# Patient Record
Sex: Male | Born: 1980 | Race: Black or African American | Hispanic: No
Health system: Southern US, Community
[De-identification: ages and names within clinical notes are randomized; demographics above are authoritative.]

---

## 2014-12-26 ENCOUNTER — Encounter (HOSPITAL_COMMUNITY): Payer: Self-pay

## 2014-12-26 ENCOUNTER — Emergency Department (HOSPITAL_COMMUNITY): Payer: Self-pay

## 2014-12-26 ENCOUNTER — Emergency Department (HOSPITAL_COMMUNITY)
Admission: EM | Admit: 2014-12-26 | Discharge: 2014-12-26 | Disposition: A | Discharge status: Home / Self Care | Payer: Self-pay | Attending: Emergency Medicine | Admitting: Emergency Medicine

## 2014-12-26 DIAGNOSIS — Y9289 Other specified places as the place of occurrence of the external cause: Secondary | ICD-10-CM | POA: Insufficient documentation

## 2014-12-26 DIAGNOSIS — S8992XA Unspecified injury of left lower leg, initial encounter: Secondary | ICD-10-CM | POA: Insufficient documentation

## 2014-12-26 DIAGNOSIS — W108XXA Fall (on) (from) other stairs and steps, initial encounter: Secondary | ICD-10-CM | POA: Insufficient documentation

## 2014-12-26 DIAGNOSIS — W19XXXA Unspecified fall, initial encounter: Secondary | ICD-10-CM

## 2014-12-26 DIAGNOSIS — M25562 Pain in left knee: Secondary | ICD-10-CM

## 2014-12-26 DIAGNOSIS — Y9301 Activity, walking, marching and hiking: Secondary | ICD-10-CM | POA: Insufficient documentation

## 2014-12-26 DIAGNOSIS — S3992XA Unspecified injury of lower back, initial encounter: Secondary | ICD-10-CM | POA: Insufficient documentation

## 2014-12-26 DIAGNOSIS — Y998 Other external cause status: Secondary | ICD-10-CM | POA: Insufficient documentation

## 2014-12-26 MED ORDER — IBUPROFEN 800 MG PO TABS: 800.0000 mg | ORAL_TABLET | Freq: Three times a day (TID) | ORAL | Status: AC

## 2014-12-26 MED ORDER — OXYCODONE-ACETAMINOPHEN 5-325 MG PO TABS: 2.0000 | ORAL_TABLET | ORAL | Status: AC | PRN

## 2014-12-26 MED ORDER — KETOROLAC TROMETHAMINE 60 MG/2ML IM SOLN
60.0000 mg | Freq: Once | INTRAMUSCULAR | Status: AC
  Administered 2014-12-26: 60 mg via INTRAMUSCULAR
  Filled 2014-12-26: qty 2

## 2014-12-26 NOTE — Discharge Instructions (Signed)
You have been seen today for knee pain. Your imaging showed no new abnormalities. Follow up with PCP as needed. Return to ED should symptoms worsen. Follow-up with orthopedics if your pain is no better in one week.   Emergency Department Resource Guide 1) Find a Doctor and Pay Out of Pocket Although you won't have to find out who is covered by your insurance plan, it is a good idea to ask around and get recommendations. You will then need to call the office and see if the doctor you have chosen will accept you as a new patient and what types of options they offer for patients who are self-pay. Some doctors offer discounts or will set up payment plans for their patients who do not have insurance, but you will need to ask so you aren't surprised when you get to your appointment.  2) Contact Your Local Health Department Not all health departments have doctors that can see patients for sick visits, but many do, so it is worth a call to see if yours does. If you don't know where your local health department is, you can check in your phone book. The CDC also has a tool to help you locate your state's health department, and many state websites also have listings of all of their local health departments.  3) Find a Walk-in Clinic If your illness is not likely to be very severe or complicated, you may want to try a walk in clinic. These are popping up all over the country in pharmacies, drugstores, and shopping centers. They're usually staffed by nurse practitioners or physician assistants that have been trained to treat common illnesses and complaints. They're usually fairly quick and inexpensive. However, if you have serious medical issues or chronic medical problems, these are probably not your best option.  No Primary Care Doctor: - Call Health Connect at  [phone] - they can help you locate a primary care doctor that  accepts your insurance, provides certain services, etc. - Physician Referral Service-  [phone]  Chronic Pain Problems: Organization         Address  Phone   Notes  [facility]  [phone] Patients need to be referred by their primary care doctor.   Medication Assistance: Organization         Address  Phone   Notes  [facility]ication [facility] [address] [place], [place] [address] [phone] --Must be a resident of [facility] -- Must have NO insurance coverage whatsoever (no Medicaid/ Medicare, etc.) -- The pt. MUST have a primary care doctor that directs their care regularly and follows them in the community   MedAssist  [phone]   [employer]  [phone]    Agencies that provide inexpensive medical care: Organization         Address  Phone   Notes  [facility]  [phone]) [phone]   [facility]    [phone]   [facility] [address] [place], [place] [address] [phone]   [facility] [address], [place] [phone]   [facility]anned Parenthood    [phone](336) [phone]   [facility]    [phone]) [phone]   [facility]  [address], [place] Phone:  [phone], Fax:  [phone] Hours of Operation:  9 am - 6 pm, M-F.  Also accepts Medicaid/Medicare and self-pay.  [facility] for [place] [address], [address], [place] Phone: [phone], Fax: [phone]. Hours of Operation:  8:30 am - 5:30 pm, M-F.  Also accepts Medicaid and self-pay.  [facility] [place] [address] [place] Phone: [phone]   [place], [place], [place] [phone], Ext. [phone] Mondays & Thursdays: 7-9 AM.  First 15 patients are seen on a first come, first serve basis.    [place] Providers:  Organization         Address  Phone   Notes  [facility] [address],  [place] [phone] Also accepts self-pay patients.  [facility] [id number] [place], [place]  [phone]   [place], [address], [place] [phone]   [facility] [address] [place] [phone]   Lucianne Lei [address], [place]   [phone] Only accepts [place] Access [place] patients after they have their name applied to their card.   Self-Pay (no insurance) in [facility]:  Organization         Address  Phone   Notes  Sickle Cell Patients, [facility] [place] [phone]   [facility] [place] [phone]   [facility] [place]  [place], [place], [place] [phone]   [facility]/Dr. Osei-Bonsu  [address] [place] [address] [place] or [address], [place] [phone] Phone number for both [place] and [place] locations is the same.  Urgent Medical and [facility] [address], [place] [phone]   [facility] [address] [place] or [address] [phone]   [facility] [address], [place] [phone], phone; [phone], fax Sees patients 1st and 3rd Saturday of every month.  Must not qualify for public or private insurance (i.e. Medicaid, Medicare, [place] Health Choice, Veterans' Benefits)  Household income should be no more than 200% of the poverty level The clinic cannot treat you if you are pregnant or think you are pregnant  Sexually transmitted diseases are not treated at the clinic.    Dental Care: Organization         Address  Phone  Notes  [facility] of [facility] [place] [phone] Accepts children up to age 81 who are enrolled in  [place] or [place]; pregnant women with a Medicaid card; and children who have applied for Medicaid or  Health Choice, but were declined, whose parents can pay a reduced fee at time of service.  [facility] Department of [facility]  [address], [place] [phone] Accepts children up to age 44 who are enrolled in [place] or [place]; pregnant women with a Medicaid card; and children who have applied for Medicaid or  Health Choice, but were declined, whose parents can pay a reduced fee at time of service.  [facility]  [place] [phone] Patients are seen by appointment only. Walk-ins are not accepted. [place] will see patients 52 years of age and older. Monday - Tuesday (8am-5pm) Most Wednesdays (8:30-5pm) $30 per  visit, cash only  [facility] Adult [employer] PROGRAM  [address], [address] [phone] Patients are seen by appointment only. Walk-ins are not accepted. [place] will see patients 48 years of age and older. One Wednesday Evening (Monthly: Volunteer Based).  $30 per visit, cash only  [place]  [phone] for adults; Children under age 62, call Graduate Pediatric Dentistry at [phone]. Children aged 41-14, please call [phone] to request a pediatric application.  Dental services are provided in all areas of dental care including fillings, crowns and bridges, complete and partial dentures, implants, gum treatment, root canals, and extractions. Preventive care is also provided. Treatment is provided to both adults and children. Patients are selected via a lottery and there is often a waiting list.   [facility] [address] [place]  [phone] [email or web address]   [facility] [address] [place], [place] [phone], Ext. [phone] Second and Fourth Thursday of each month, opens at 6:30  AM; Clinic ends at 9 AM.  Patients are seen on a first-come first-served basis, and a limited number are seen during each clinic.   [facility]  [address] Hillard Danker [place], [place] [phone]   Eligibility Requirements You must have lived in [place], [place], or [place] counties for at least the last three months.   You cannot be eligible for state or federal sponsored [employer], including [employer], [place], or [employer].   You generally cannot be eligible for healthcare insurance through your employer.    How to apply: Eligibility screenings are held every Tuesday and Wednesday afternoon from 1:00 pm until 4:00 pm. You do not need an appointment for the interview!  [facility] [address], [place], [place]   [place]  [place] Department  [place]  [phone]    Behavioral Health Resources in the Community: Intensive Outpatient Programs Organization         Address  Phone  Notes  [facility] [place]. [address] [place], [place] [phone]   [facility] Outpatient [address] [place], [place]   ADS: Alcohol & Drug Svcs [address] [place], [place]   [place] [address]  [place], [place] or [phone]   Substance Abuse Resources Organization         Address  Phone  Notes  Alcohol and Drug Services  [phone]   [place]  [phone]   The [place]   Chinita Pester  [phone]   Residential & Outpatient Substance Abuse Program  [phone]   Psychological Services Organization         Address  Phone  Notes  [facility] [place]  [place]  [phone]   [place] [address] [place] or  [phone]    Mobile Crisis Teams Organization         Address  Phone  Notes  [facility], Mobile Crisis Care Unit  [phone]   [facility]  [address] [place], [place]   [place] Levels [address], [place] [phone]    Self-Help/Support Groups Organization         Address  Phone  Notes  [facility] [place] - variety of support groups  [place] Call for more information  Narcotics Anonymous (NA), [facility] [address], [employer] [place]  2 meetings at this location   Special educational needs teacher         Address  Phone  Notes  [facility] Residential Treatment [place],    [place]  [phone]   [facility]  [address] [place] [id number], [place], [place]   [place], [place] [phone] Admissions: 8am-3pm M-F  Incentives Substance [place] [address]    [place], [place] [phone]   [facility] [address] [place], [place], [place]   [facility] [address],  [place], [place]   [facility] - Intensive Outpatient [address] Kristeen [address], [place], [place]   [facility] ([place] [address].,  [place], [place] [phone] or [phone]   [facility] Services (RTS) [address] [place], [place] Accepts Medicaid  [facility] [place] [address],  [place] [phone] Substance Abuse/Addiction Treatment   [facility] Organization         Address  Phone  Notes  [facility]  [phone]   Domenic Schwab, PhD [address] Arlis Porta [place], [place]   [phone] or [phone]   [place] and [place], [place] [phone]   [facility] [address],  [place], [place] [phone] Insurance/Medicaid/sponsorship through [facility] and Families [address], [address] [place]                                    [place], [place] [phone] [place] [address], [place] [phone]    Dr. Adele Schilder  [phone]   [facility] Dept. 1) [address], [place] 2) [place] 3)  [place] [address], [place] [phone]  [phone]   [place] [phone] or [phone] (After Hours)

## 2014-12-26 NOTE — ED Provider Notes (Signed)
CSN: [id number]     Arrival date & time 12/26/14  1011 History   First MD Initiated Contact with Patient 12/26/14 1017     Chief Complaint  Patient presents with  . Knee Injury     (Consider location/radiation/quality/duration/timing/severity/associated sxs/prior Treatment) HPI   Bradley ButcherFrederick Rush is a 34 y.o. male, patient with no significant past medical history, presenting to the ED with left knee pain following a fall this morning. Pt states he tripped and stumbled down two steps on his front walk. Pt did not fall to the ground, but caught himself by grabbing the railing. Pt states that when he started to fall, he twisted his left leg at the knee. Pt rates his pain at 6/10, throbbing in nature, non-radiating. Pt also complains of some back pain in the right lower back. Back pain is 2/10, feels like "stiffness," non-radiating. Pt denies hitting his head, LOC, neuro deficits, or any other pain or complaints.     No past medical history on file. No past surgical history on file. No family history on file. Social History  Substance Use Topics  . Smoking status: Not on file  . Smokeless tobacco: Not on file  . Alcohol Use: Not on file    Review of Systems  Musculoskeletal: Positive for back pain and arthralgias (Left knee pain).      Allergies  Other  Home Medications   Prior to Admission medications   Medication Sig Start Date End Date Taking? Authorizing Provider  ibuprofen (ADVIL,MOTRIN) 800 MG tablet Take 1 tablet (800 mg total) by mouth 3 (three) times daily. 12/26/14   Kary Colaizzi C Carleena Mires, PA-C  oxyCODONE-acetaminophen (PERCOCET/ROXICET) 5-325 MG tablet Take 2 tablets by mouth every 4 (four) hours as needed for severe pain. 12/26/14   Faithanne Verret C Demba Nigh, PA-C   BP 131/81 mmHg  Pulse 78  Temp(Src) 98.3 F (36.8 C) (Oral)  Resp 18  SpO2 100% Physical Exam  Constitutional: He appears well-developed and well-nourished. No distress.  HENT:  Head: Normocephalic and atraumatic.   Cardiovascular: Normal rate, regular rhythm and intact distal pulses.   Pulmonary/Chest: Effort normal.  Musculoskeletal: Normal range of motion.  Lower right back pain reproducible with patient movement. Full ROM in all extremities and spine. No paraspinal tenderness. No discernible swelling and no effusion. No laxity.  Neurological: He is alert. He has normal reflexes.  No sensory deficits. Strength 5/5 in all extremities. No gait disturbance.  Skin: Skin is warm and dry.  Nursing note and vitals reviewed.   ED Course  Procedures (including critical care time) Labs Review Labs Reviewed - No data to display  Imaging Review Dg Knee Complete 4 Views Left  12/26/2014  CLINICAL DATA:  Status post fall down stairs at 9 a.m. this morning with a twisting injury of the left knee and onset of pain. Initial encounter. EXAM: LEFT KNEE - COMPLETE 4+ VIEW COMPARISON:  None. FINDINGS: The patient has a large osteochondral lesion of the medial femoral condyle with a loose bony fragment measuring 0.6 cm craniocaudal by 2.0 cm AP by 1.7 cm transverse identified. The loose body is well corticated and this finding is likely chronic. No acute fracture is identified. No joint effusion is seen. Joint spaces are preserved. IMPRESSION: No acute abnormality. 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 Electronically Signed   By: Drusilla Kannerhomas  Dalessio M.D.   On: 12/26/2014 11:23   I have personally  reviewed and evaluated these images and lab results as part of my medical decision-making.   EKG Interpretation None      MDM   Final diagnoses:  Left knee pain  Fall, initial encounter    Angelgabriel Willmore presents with left knee pain following a twisting motion.  Findings and plan of care discussed with Rolland Porter, MD.  Suspect a sprain. No acute abnormalities found on x-ray. No imaging indicated for  the patient's back. Patient will be placed in a knee immobilizer, given crutches, anti-inflammatory medications and pain medications, and referred orthopedics if his pain is no better in a week. No red flags for back or knee pain. This plan of care and the x-ray results were communicated with the patient. Patient voiced understanding of his discharge instructions, agreed to the plan, and is comfortable with discharge.  Anselm Pancoast, PA-C 12/26/14 1216  Rolland Porter, MD 12/27/14 [phone]

## 2014-12-26 NOTE — ED Notes (Signed)
Pt slipped on 5th step of 16. Pt attempted to catch himself and has minor cut to left 3rd digit. Pt c/o pain 6/10 in left knee with movement and 2/10 pain when knee is not in motion. Pt also c/o right back pain that he thinks is a result of attempting to break his fall.

## 2014-12-26 NOTE — ED Notes (Signed)
Bed: [id number] Expected date:  Expected time:  Means of arrival:  Comments: EMS- 34yo M, Fall/knee pain

## 2017-06-08 IMAGING — CR DG KNEE COMPLETE 4+V*L*
6 series · 6 of 6 positions shown · non-contrast
Comparison: None.

CLINICAL DATA: Status post fall down stairs at 9 a.m. this morning
with a twisting injury of the left knee and onset of pain. Initial
encounter.

EXAM:
LEFT KNEE - COMPLETE 4+ VIEW

[x knee ap left (1 of 4)]
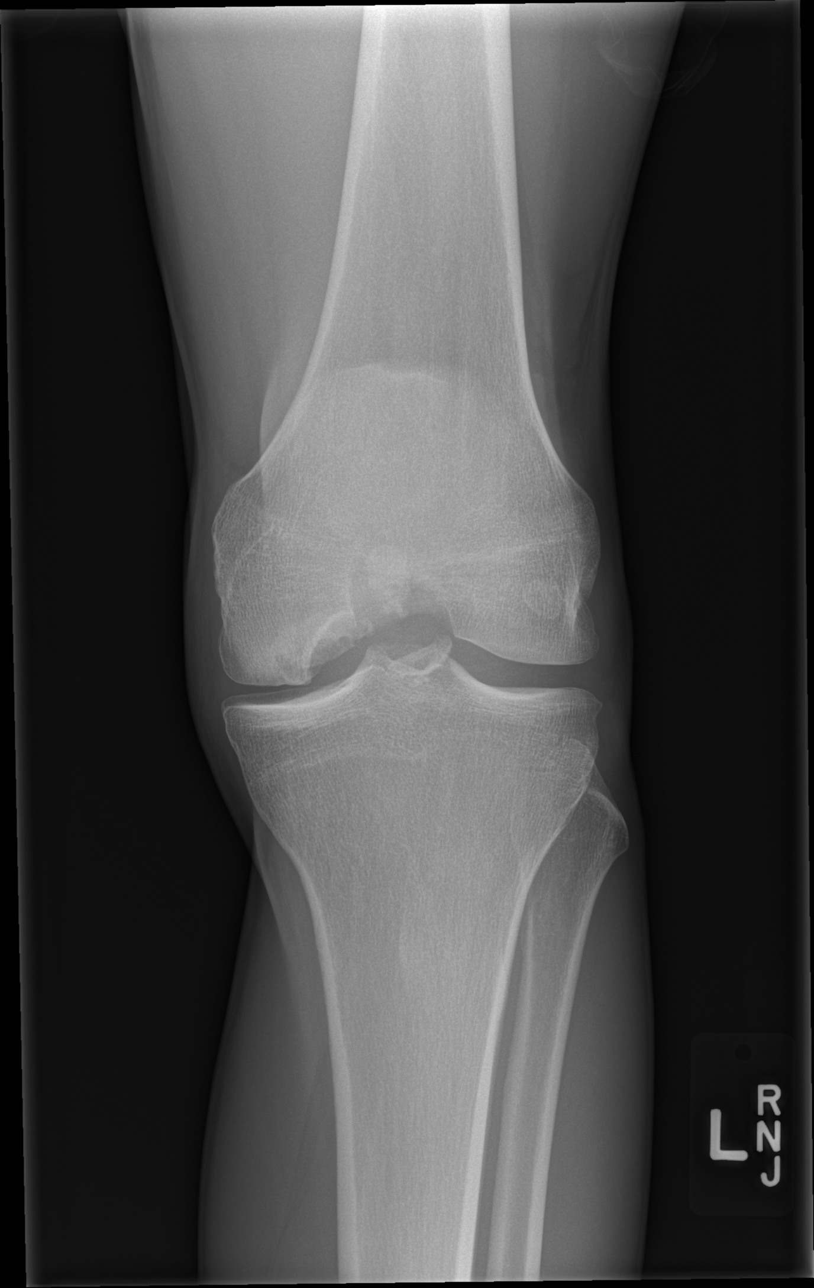

[x knee ap left (2 of 4)]
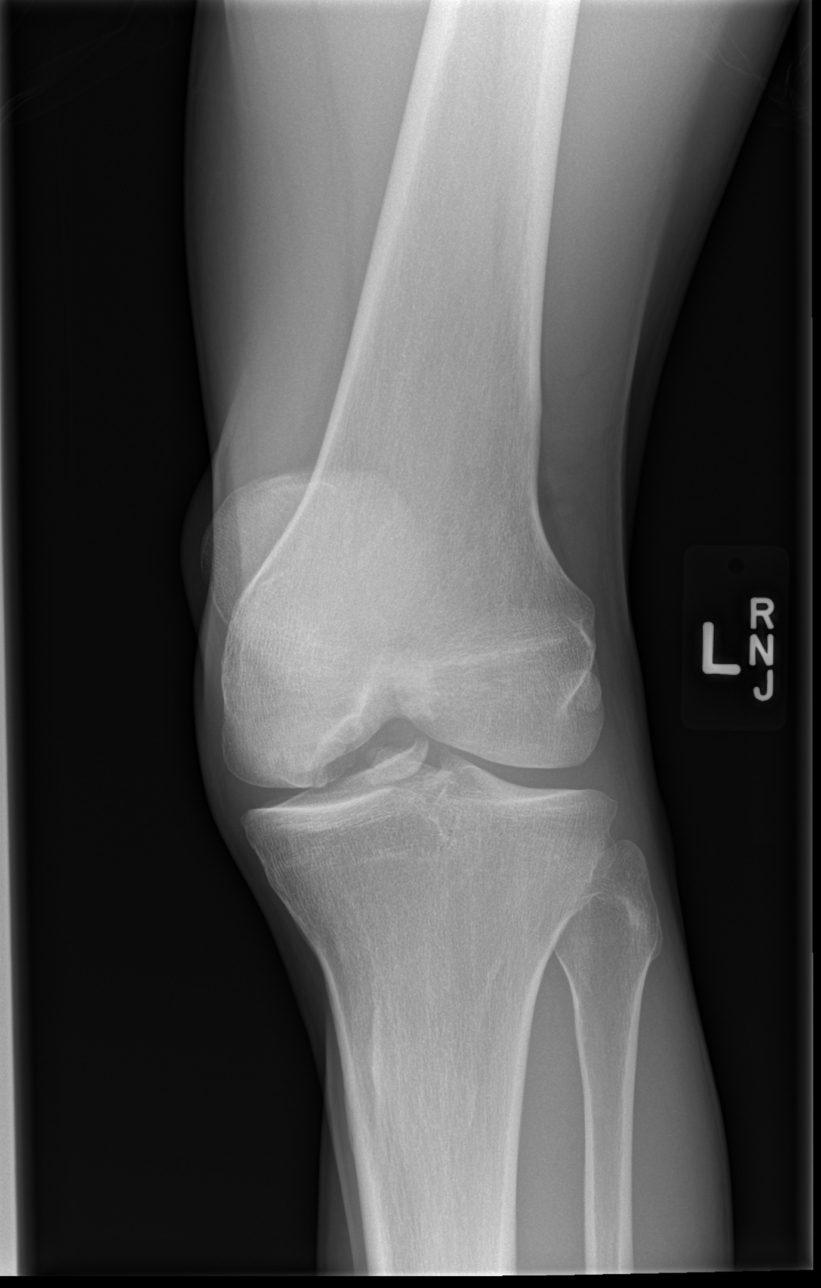

[x knee ap left (3 of 4)]
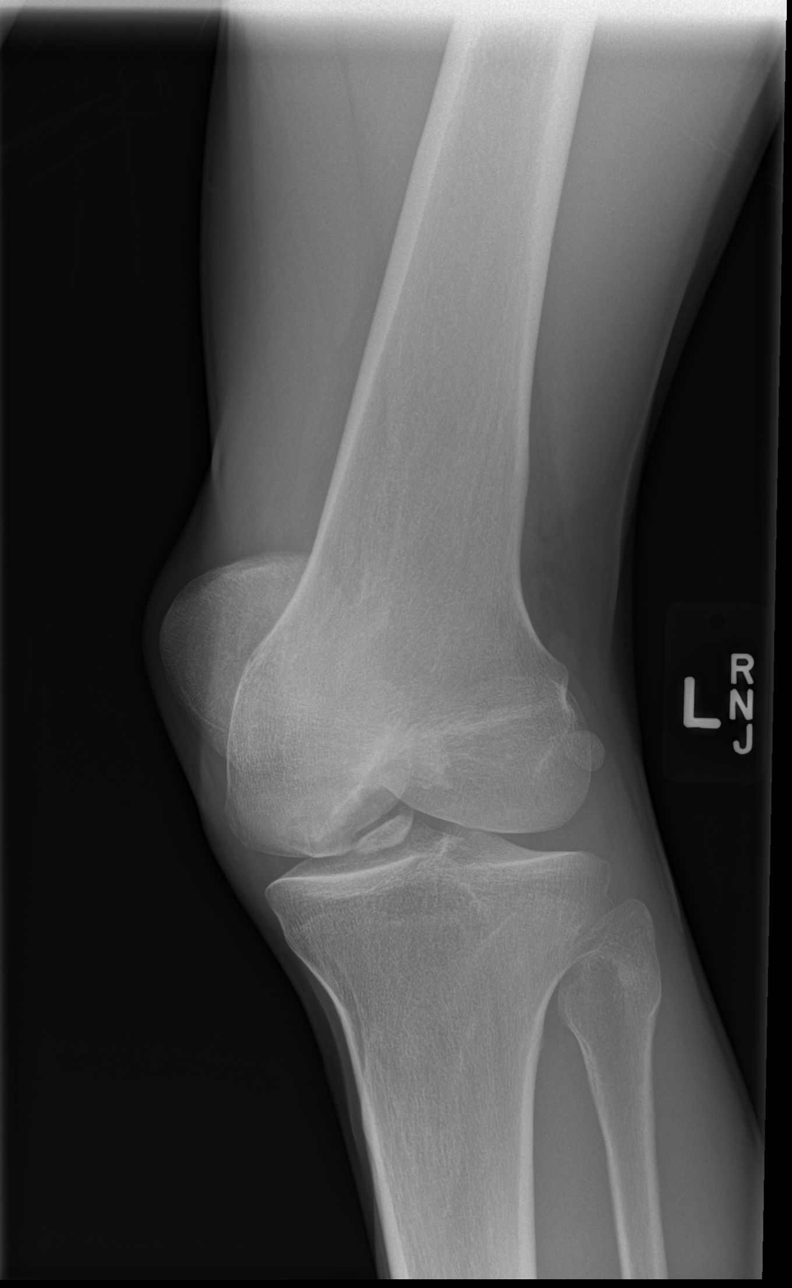

[x knee ap left (4 of 4)]
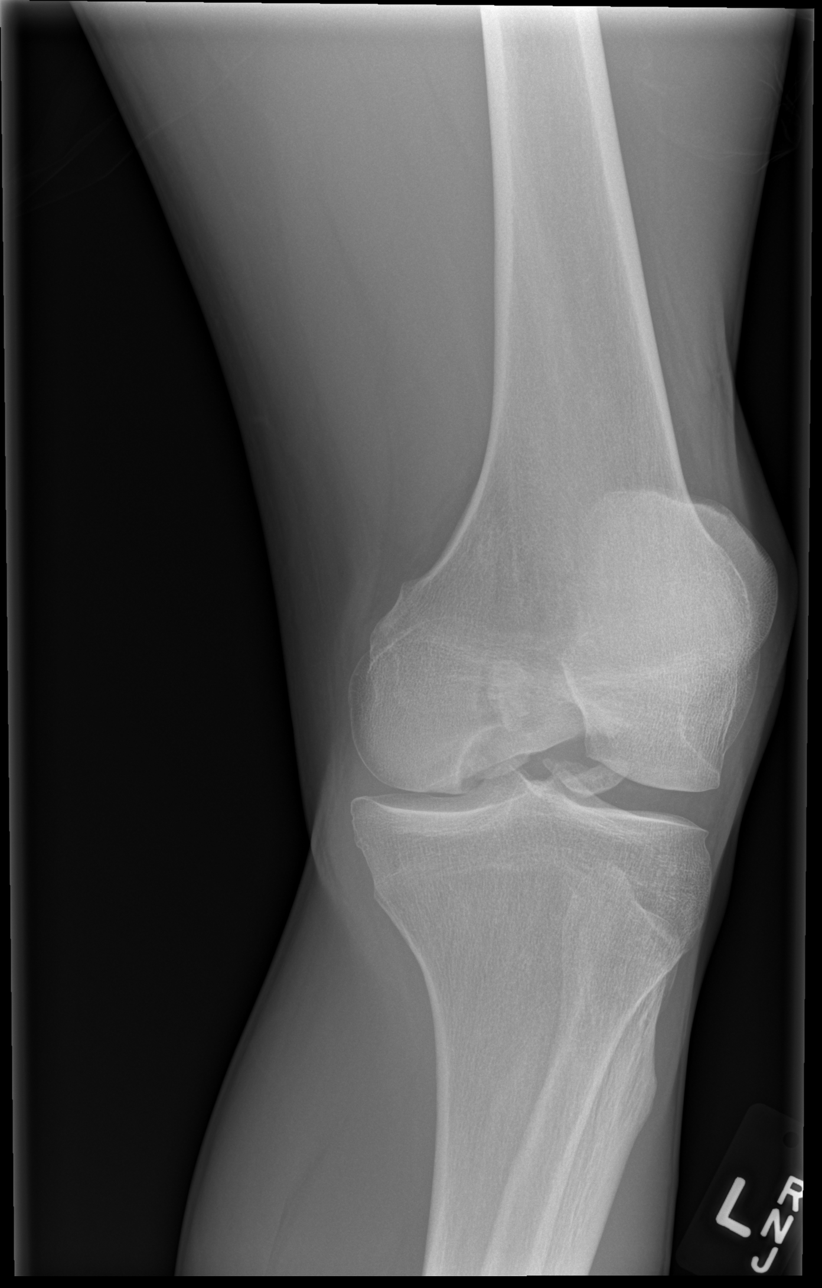

[x knee lat left (1 of 2)]
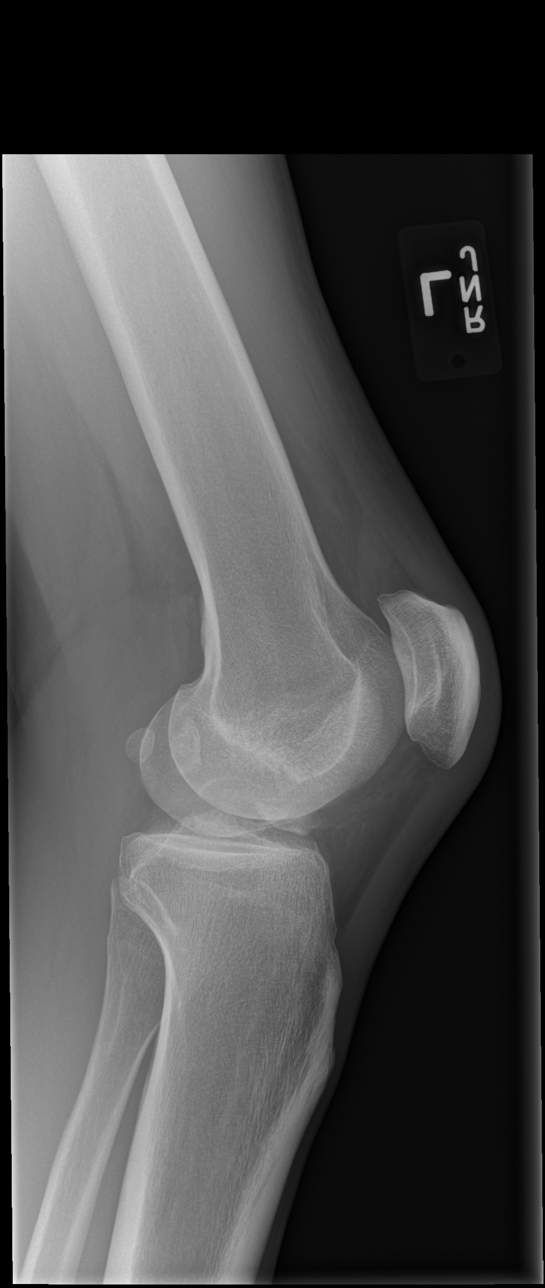

[x knee lat left (2 of 2)]
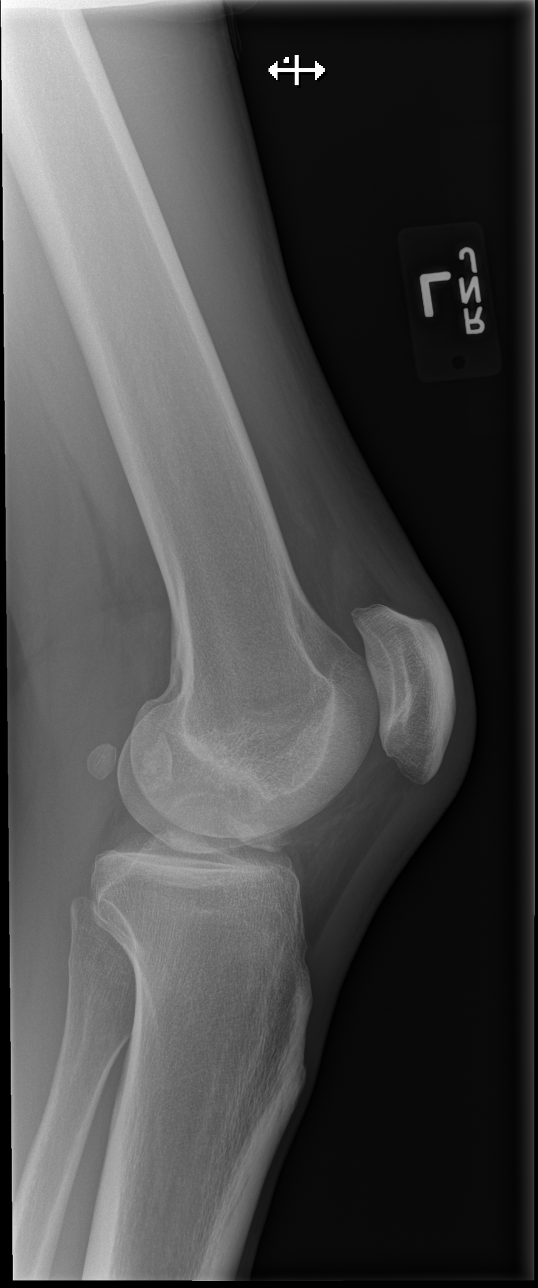

[6 of 6 positions shown; findings below may reference images not displayed]

FINDINGS: The patient has a large osteochondral lesion of the medial femoral
condyle with a loose bony fragment measuring 0.6 cm craniocaudal by
2.0 cm AP by 1.7 cm transverse identified. The loose body is well
corticated and this finding is likely chronic. No acute fracture is
identified. No joint effusion is seen. Joint spaces are preserved.
IMPRESSION: No acute abnormality.

Chronic appearing large osteochondral lesion medial femoral condyle
with a large loose bone fragment adjacent to the defect identified
as described above.
